# Patient Record
Sex: Male | Born: 1975 | Race: White | Hispanic: No | Marital: Married | Smoking: Never smoker
Health system: Southern US, Community
[De-identification: ages and names within clinical notes are randomized; demographics above are authoritative.]

## PROBLEM LIST (undated history)

## (undated) DIAGNOSIS — M5136 Other intervertebral disc degeneration, lumbar region: Secondary | ICD-10-CM

## (undated) DIAGNOSIS — M51369 Other intervertebral disc degeneration, lumbar region without mention of lumbar back pain or lower extremity pain: Secondary | ICD-10-CM

---
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
## 2015-11-28 ENCOUNTER — Emergency Department (HOSPITAL_COMMUNITY)

## 2015-11-28 ENCOUNTER — Emergency Department (HOSPITAL_COMMUNITY)
Admission: EM | Admit: 2015-11-28 | Discharge: 2015-11-28 | Disposition: A | Discharge status: Home / Self Care | Attending: Emergency Medicine | Admitting: Emergency Medicine

## 2015-11-28 ENCOUNTER — Encounter (HOSPITAL_COMMUNITY): Payer: Self-pay | Admitting: *Deleted

## 2015-11-28 DIAGNOSIS — Z79899 Other long term (current) drug therapy: Secondary | ICD-10-CM | POA: Diagnosis not present

## 2015-11-28 DIAGNOSIS — M546 Pain in thoracic spine: Secondary | ICD-10-CM

## 2015-11-28 HISTORY — DX: Other intervertebral disc degeneration, lumbar region: M51.36

## 2015-11-28 HISTORY — DX: Other intervertebral disc degeneration, lumbar region without mention of lumbar back pain or lower extremity pain: M51.369

## 2015-11-28 MED ORDER — HYDROCODONE-ACETAMINOPHEN 5-325 MG PO TABS: 1.0000 | ORAL_TABLET | ORAL | 0 refills | Status: DC | PRN

## 2015-11-28 MED ORDER — DIAZEPAM 5 MG PO TABS
5.0000 mg | ORAL_TABLET | Freq: Once | ORAL | Status: AC
  Administered 2015-11-28: 5 mg via ORAL
  Filled 2015-11-28: qty 1

## 2015-11-28 MED ORDER — HYDROCODONE-ACETAMINOPHEN 5-325 MG PO TABS
1.0000 | ORAL_TABLET | Freq: Once | ORAL | Status: AC
  Administered 2015-11-28: 1 via ORAL
  Filled 2015-11-28: qty 1

## 2015-11-28 MED ORDER — DIAZEPAM 5 MG PO TABS: 5.0000 mg | ORAL_TABLET | Freq: Two times a day (BID) | ORAL | 0 refills | Status: DC

## 2015-11-28 MED ORDER — IBUPROFEN 600 MG PO TABS: 600.0000 mg | ORAL_TABLET | Freq: Four times a day (QID) | ORAL | 0 refills | Status: DC | PRN

## 2015-11-28 NOTE — ED Notes (Signed)
Bed: WA02 Expected date:  Expected time:  Means of arrival:  Comments: 

## 2015-11-28 NOTE — ED Provider Notes (Signed)
[facility]-EMERGENCY DEPT Provider Note   CSN: [id number] Arrival date & time: 11/28/15  [id number]     History   Chief Complaint Chief Complaint  Patient presents with  . Back Pain  . Torticollis    HPI Adrian Park is a 40 y.o. male.  Patient is a 40 year old male who presents the ED with complaint of neck and back pain, onset this morning. Patient reports when he woke up this morning he began having "spasming" pain to his neck and upper back which she notes is worse with movement. He reports pain is worse when taking a deep breath. Denies radiation. Denies any known injury, trauma or recent falls but notes he was playing with his kids last night. Patient reports taking ibuprofen and muscle relaxant this morning without relief. Denies fever, headache, neck stiffness, chest pain, difficulty breathing, abdominal pain, numbness, tingling, weakness, saddle anesthesia, urinary/bowel incontinence, urinary symptoms. Reports hx of chronic lower back pain.      Past Medical History:  Diagnosis Date  . Degenerative disc disease, lumbar     There are no active problems to display for this patient.   Past Surgical History:  Procedure Laterality Date  . FRACTURE SURGERY Right 1996   index knuckle       Home Medications    Prior to Admission medications   Medication Sig Start Date End Date Taking? Authorizing Provider  allopurinol (ZYLOPRIM) 300 MG tablet Take 300 mg by mouth daily. 10/18/15  Yes Historical Provider, MD  cholecalciferol (VITAMIN D) 1000 units tablet Take 2,000 Units by mouth daily.   Yes Historical Provider, MD  diclofenac (VOLTAREN) 75 MG EC tablet Take 75 mg by mouth 2 (two) times daily. 11/19/15  Yes Historical Provider, MD  multivitamin (ONE-A-DAY MEN'S) TABS tablet Take 1 tablet by mouth daily.   Yes Historical Provider, MD  Omega-3 Fatty Acids (FISH OIL) 1000 MG CAPS Take 1,000 mg by mouth daily.   Yes Historical Provider, MD  diazepam (VALIUM) 5 MG tablet Take 1 tablet  (5 mg total) by mouth 2 (two) times daily. 11/28/15   Barrett HenleNicole Elizabeth Helios Kohlmann, PA-C  HYDROcodone-acetaminophen (NORCO/VICODIN) 5-325 MG tablet Take 1 tablet by mouth every 4 (four) hours as needed. 11/28/15   Barrett HenleNicole Elizabeth Maanya Hippert, PA-C  ibuprofen (ADVIL,MOTRIN) 600 MG tablet Take 1 tablet (600 mg total) by mouth every 6 (six) hours as needed. 11/28/15   Barrett HenleNicole Elizabeth Jyasia Markoff, PA-C    Family History No family history on file.  Social History Social History  Substance Use Topics  . Smoking status: Never Smoker  . Smokeless tobacco: Never Used  . Alcohol use No     Allergies   Review of patient's allergies indicates no known allergies.   Review of Systems Review of Systems  Musculoskeletal: Positive for back pain and neck pain.  All other systems reviewed and are negative.    Physical Exam Updated Vital Signs BP 112/65 (BP Location: Right Arm)   Pulse (!) 50   Temp 97.7 F (36.5 C) (Oral)   Resp 16   Ht 6\' 2"  (1.88 m)   Wt 102.1 kg   SpO2 98%   BMI 28.89 kg/m   Physical Exam  Constitutional: He is oriented to person, place, and time. He appears well-developed and well-nourished.  HENT:  Head: Normocephalic and atraumatic.  Mouth/Throat: Oropharynx is clear and moist. No oropharyngeal exudate.  Eyes: Conjunctivae and EOM are normal. Right eye exhibits no discharge. Left eye exhibits no discharge. No scleral icterus.  Neck: Normal  range of motion. Neck supple.  Cardiovascular: Normal rate, regular rhythm, normal heart sounds and intact distal pulses.   Pulmonary/Chest: Effort normal and breath sounds normal. No respiratory distress. He has no wheezes. He has no rales. He exhibits no tenderness.  Abdominal: Soft. Bowel sounds are normal. He exhibits no distension. There is no tenderness.  Musculoskeletal: Normal range of motion. He exhibits tenderness. He exhibits no edema or deformity.  No midline C or L tenderness. Midline mid-thoracic spine tenderness. TTP over  bilateral upper thoracic and cervical paraspinal muscles. Full range of motion of neck and back. Full range of motion of bilateral upper and lower extremities, with 5/5 strength. Sensation intact. 2+ radial and PT pulses. Cap refill <2 seconds.   Neurological: He is alert and oriented to person, place, and time.  Skin: Skin is warm and dry. Capillary refill takes less than 2 seconds.  Nursing note and vitals reviewed.    ED Treatments / Results  Labs (all labs ordered are listed, but only abnormal results are displayed) Labs Reviewed - No data to display  EKG  EKG Interpretation None       Radiology Dg Thoracic Spine 2 View  Result Date: 11/28/2015 CLINICAL DATA:  Back pain EXAM: THORACIC SPINE 2 VIEWS COMPARISON:  None. FINDINGS: There is no evidence of thoracic spine fracture. Alignment is normal. No other significant bone abnormalities are identified. IMPRESSION: Negative. Electronically Signed   By: Marlan Palau M.D.   On: 11/28/2015 13:17    Procedures Procedures (including critical care time)  Medications Ordered in ED Medications  diazepam (VALIUM) tablet 5 mg (5 mg Oral Given 11/28/15 1143)  HYDROcodone-acetaminophen (NORCO/VICODIN) 5-325 MG per tablet 1 tablet (1 tablet Oral Given 11/28/15 1143)     Initial Impression / Assessment and Plan / ED Course  I have reviewed the triage vital signs and the nursing notes.  Pertinent labs & imaging results that were available during my care of the patient were reviewed by me and considered in my medical decision making (see chart for details).  Clinical Course    Patient presents with upper back and neck pain that started this morning when he woke up. He reports he was playing with his kids last name but denies any known injury, recent fall or trauma. VSS. Exam revealed tenderness over bilateral upper thoracic and cervical paraspinal muscles, midline upper thoracic spinal tenderness. No neuro deficits. Patient given pain  meds and muscle relaxant in the ED. Thoracic spine x-ray unremarkable. On reevaluation patient reports his pain has improved. Suspect patient's pain is likely due to muscle strain/spasms. Plan to discharge patient home with pain meds, muscle relaxant and NSAID's along with symptomatically treatment. Advised patient to follow up with PCP as needed. Discussed return precautions.  Final Clinical Impressions(s) / ED Diagnoses   Final diagnoses:  Bilateral thoracic back pain    New Prescriptions New Prescriptions   DIAZEPAM (VALIUM) 5 MG TABLET    Take 1 tablet (5 mg total) by mouth 2 (two) times daily.   HYDROCODONE-ACETAMINOPHEN (NORCO/VICODIN) 5-325 MG TABLET    Take 1 tablet by mouth every 4 (four) hours as needed.   IBUPROFEN (ADVIL,MOTRIN) 600 MG TABLET    Take 1 tablet (600 mg total) by mouth every 6 (six) hours as needed.     Satira Sark [place], [place] 11/28/15 1354    Gerhard Munch, MD 11/29/15 [phone]

## 2015-11-28 NOTE — ED Triage Notes (Signed)
Per EMS, pt from home, reports he was playing with his kids last night, woke up this am with back and neck pain.  Took a muscle relaxer about an hour ago.  Pain radiates to his L shoulder as well.  Denies injury at this time.

## 2015-11-28 NOTE — ED Notes (Signed)
Patient transported to X-ray 

## 2015-11-28 NOTE — Discharge Instructions (Signed)
Take your medications as prescribed as an for pain relief. I also recommend applying heat to affected area for 15 minutes 3-4 times daily for pain relief. Follow-up with your primary care provider in the next 3-4 days if your symptoms have not improved. Please return to the Emergency Department if symptoms worsen or new onset of fever, numbness, tingling, groin anesthesia, loss of bowel or bladder, weakness.

## 2015-12-26 ENCOUNTER — Ambulatory Visit (INDEPENDENT_AMBULATORY_CARE_PROVIDER_SITE_OTHER): Admitting: Orthopedic Surgery

## 2015-12-26 DIAGNOSIS — M25562 Pain in left knee: Secondary | ICD-10-CM | POA: Diagnosis not present

## 2016-01-01 ENCOUNTER — Other Ambulatory Visit (INDEPENDENT_AMBULATORY_CARE_PROVIDER_SITE_OTHER): Payer: Self-pay | Admitting: Orthopedic Surgery

## 2016-01-01 DIAGNOSIS — M25562 Pain in left knee: Principal | ICD-10-CM

## 2016-01-01 DIAGNOSIS — G8929 Other chronic pain: Secondary | ICD-10-CM

## 2016-01-06 ENCOUNTER — Ambulatory Visit
Admission: RE | Admit: 2016-01-06 | Discharge: 2016-01-06 | Disposition: A | Discharge status: Home / Self Care | Source: Ambulatory Visit | Attending: Orthopedic Surgery | Admitting: Orthopedic Surgery

## 2016-01-06 DIAGNOSIS — G8929 Other chronic pain: Secondary | ICD-10-CM

## 2016-01-06 DIAGNOSIS — M25562 Pain in left knee: Principal | ICD-10-CM

## 2016-01-18 ENCOUNTER — Encounter (INDEPENDENT_AMBULATORY_CARE_PROVIDER_SITE_OTHER): Payer: Self-pay | Admitting: Orthopedic Surgery

## 2016-01-18 ENCOUNTER — Ambulatory Visit (INDEPENDENT_AMBULATORY_CARE_PROVIDER_SITE_OTHER): Admitting: Orthopedic Surgery

## 2016-01-18 DIAGNOSIS — G8929 Other chronic pain: Secondary | ICD-10-CM | POA: Diagnosis not present

## 2016-01-18 DIAGNOSIS — M25562 Pain in left knee: Secondary | ICD-10-CM

## 2016-01-18 NOTE — Progress Notes (Signed)
Office Visit Note   Patient: Adrian Park           Date of Birth: 04-Mar-1976           MRN: [id number] Visit Date: 01/18/2016 Requested by: Joycelyn RuaStephen Meyers, MD [address] [place], [place] [address] PCP: Joycelyn RuaMEYERS, STEPHEN, MD  Subjective: Chief Complaint  Patient presents with  . Left Knee - Pain, Follow-up    HPI Adrian Park is a 40 year old patient with left knee pain.  Her follow-up MRI scan which shows some trochlear focal chondromalacia on the lateral aspect over a 5 x 5 mm area as well as what is interpreted as edema in the posterior medial tibial plateau.  I reviewed the MRI scan and based on his running history of think this represents an early stress reaction.  It is now resolving.  He describes improvement in symptoms but he has decreased his running.  He states that the his knee will continue to buckle randomly.  Stairs are okay for him.  He used to run 5-10 miles a day now he just wants to run 2 miles a day and then walk a mile a day to do that twice a day.  Recently diagnosed with psoriatic arthritis and is going to start Humira injections.  He is taking vitamin D.              Review of Systems All systems reviewed are negative as they relate to the chief complaint within the history of present illness.  Patient denies  fevers or chills.    Assessment & Plan: Visit Diagnoses:  1. Chronic pain of left knee     Plan: Impression is left knee pain with small area of focal chondromalacia on the lateral aspect of the trochlea.  I'll think this is necessarily symptomatic for him.  I think probably more symptomatically for him as this stress reaction posterior medial tibial plateau.  Think it is resolving with diminished running.  We talked a lot about activity modification in this clinical scenario.  He needs to add swimming and decrease his running in order to prevent this stress reaction from recurring.  Her basic decision point today was is there an operative problem in the knee  causing his giving way in the answer is no.  However I think that less running is going to be better for this stress reaction.  I'll see him back as needed  Follow-Up Instructions: Return if symptoms worsen or fail to improve.   Orders:  No orders of the defined types were placed in this encounter.  No orders of the defined types were placed in this encounter.     Procedures: No procedures performed   Clinical Data: No additional findings.  Objective: Vital Signs: There were no vitals taken for this visit.  Physical Exam  Constitutional: He appears well-developed.  HENT:  Head: Normocephalic.  Eyes: EOM are normal.  Neck: Normal range of motion.  Cardiovascular: Normal rate.   Pulmonary/Chest: Effort normal.  Neurological: He is alert.  Skin: Skin is warm.  Psychiatric: He has a normal mood and affect.    Ortho Exam left knee has full active and passive range of motion stable, crucial ligament intact since mechanism not much in the way of patellofemoral crepitus palpable pedal pulses no groin pain with internal/external rotation leg no other masses limitations incision of the left knee region  Specialty Comments:  No specialty comments available.  Imaging: No results found.   PMFS History:  There are no active problems to display for this patient.  Past Medical History:  Diagnosis Date  . Degenerative disc disease, lumbar     No family history on file.  Past Surgical History:  Procedure Laterality Date  . FRACTURE SURGERY Right 1996   index knuckle   Social History   Occupational History  . Not on file.   Social History Main Topics  . Smoking status: Never Smoker  . Smokeless tobacco: Never Used  . Alcohol use No  . Drug use: No  . Sexual activity: Not on file

## 2017-08-31 IMAGING — MR MR KNEE*L* W/O CM
6 series · 40 of 40 positions shown · non-contrast
Comparison: Radiographs dated 07/04/2015

CLINICAL DATA: Chronic left knee pain. The patient has been giving
way for 6 months. Recurrent swelling after exercise.

EXAM:
MRI OF THE LEFT KNEE WITHOUT CONTRAST
TECHNIQUE: Multiplanar, multisequence MR imaging of the knee was performed. No
intravenous contrast was administered.

[Series 3: PD · axial · 4.0mm · 0.50mm/px · z∈[-102,+36]mm · 8 of 30 slices shown (1 of 2)]
[im 1/30]
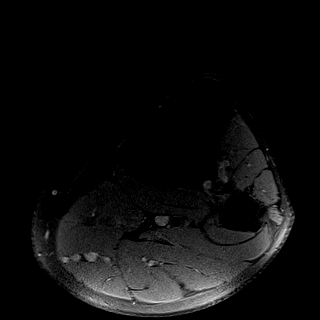
[im 5/30]
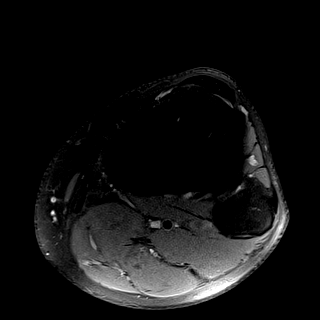
[im 9/30]
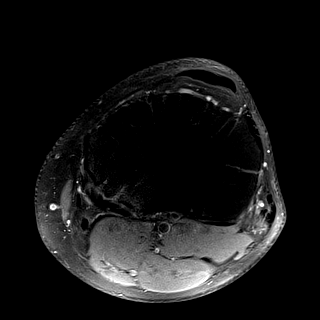
[im 13/30]
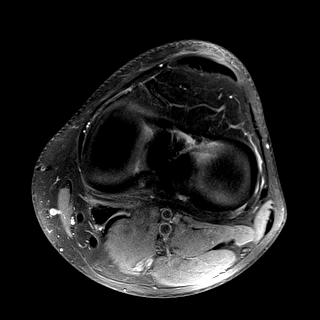
[im 17/30]
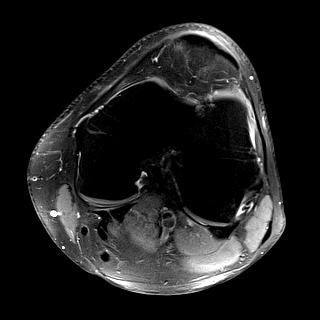
[im 21/30]
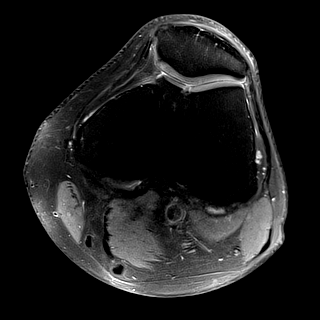
[im 25/30]
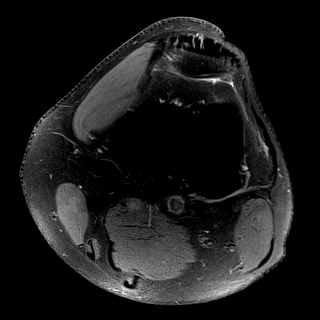
[im 30/30]
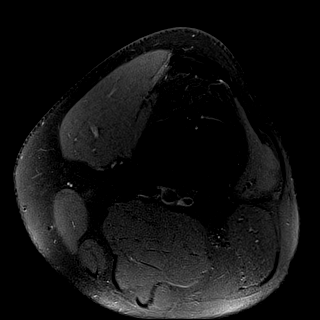

[Series 4: PD fat-sat · sagittal · 4.0mm · 0.50mm/px · 8 of 25 slices shown (1 of 2)]
[im 1/25]
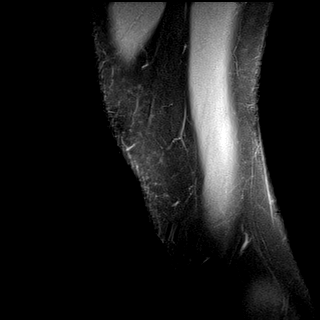
[im 4/25]
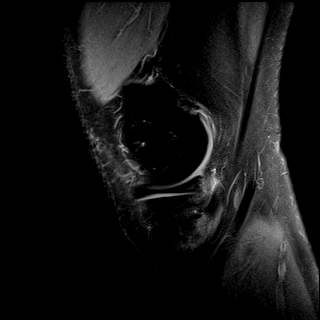
[im 7/25]
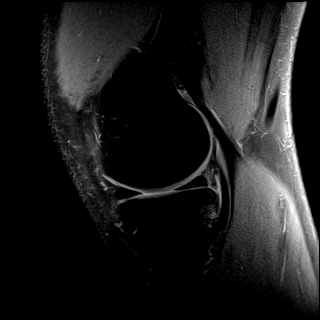
[im 11/25]
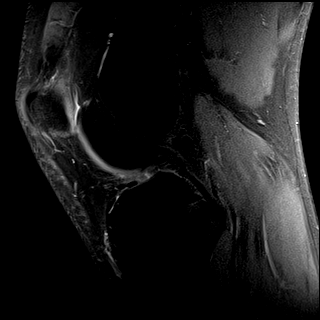
[im 14/25]
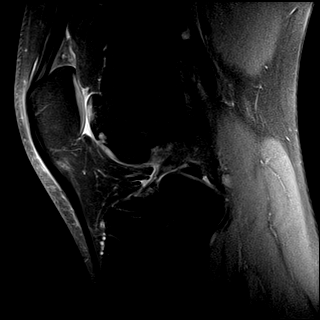
[im 18/25]
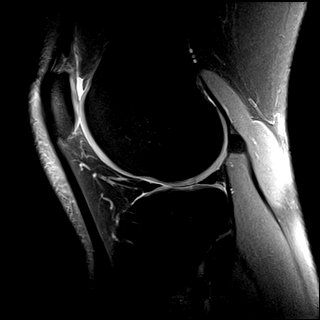
[im 21/25]
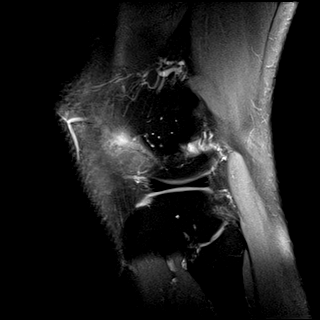
[im 25/25]
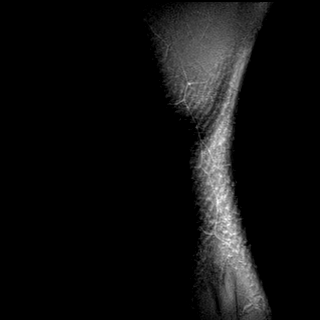

[Series 5: PD · coronal · 4.0mm · 0.53mm/px · 7 of 22 slices shown (2 of 2)]
[im 1/22]
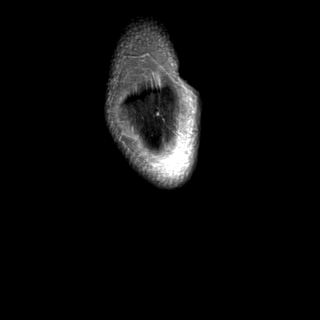
[im 4/22]
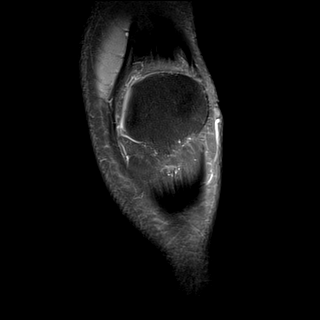
[im 8/22]
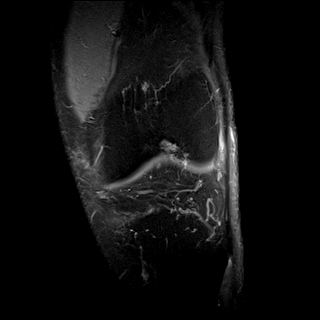
[im 11/22]
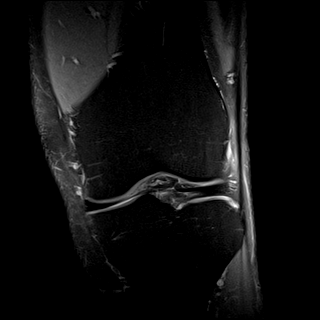
[im 15/22]
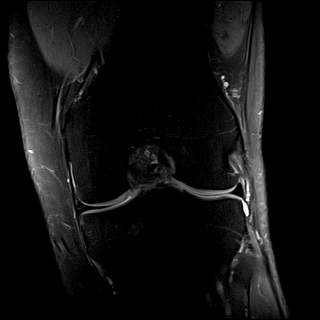
[im 18/22]
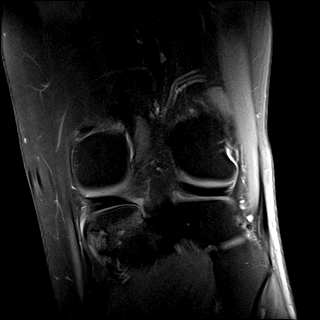
[im 22/22]
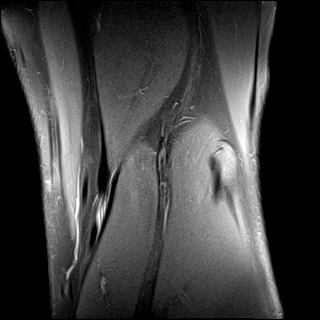

[Series 6: (id) · coronal · 4.0mm · 0.53mm/px · 7 of 22 slices shown]
[im 1/22]
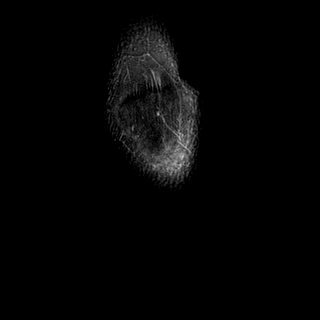
[im 4/22]
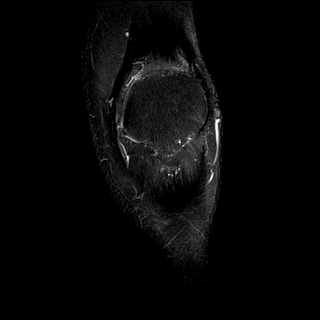
[im 8/22]
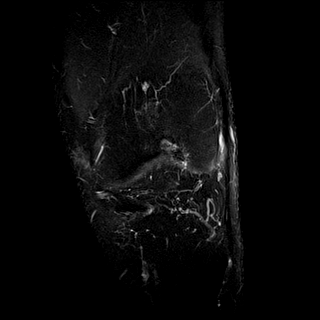
[im 11/22]
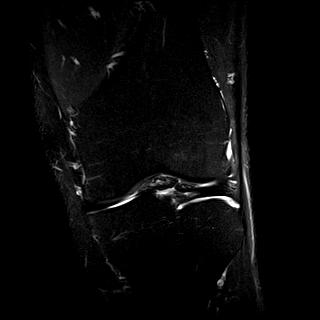
[im 15/22]
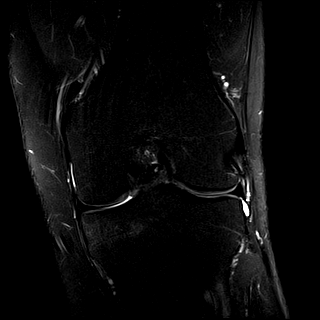
[im 18/22]
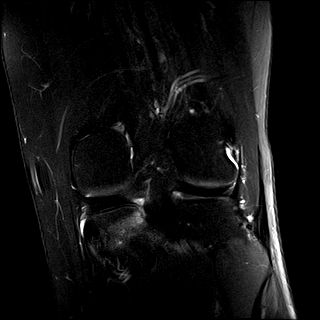
[im 22/22]
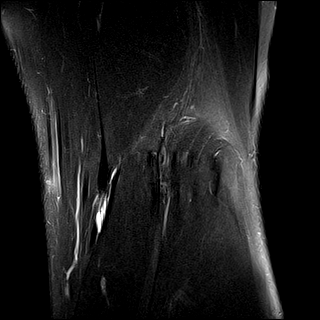

[Series 7: T1 · coronal · 4.0mm · 0.53mm/px · 7 of 22 slices shown]
[im 1/22]
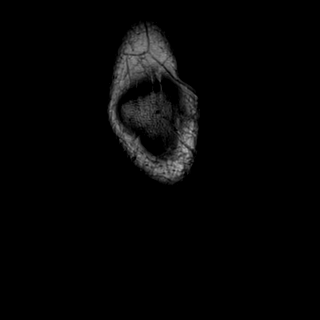
[im 4/22]
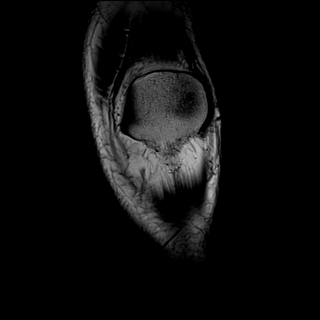
[im 8/22]
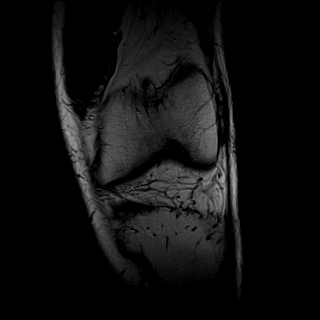
[im 11/22]
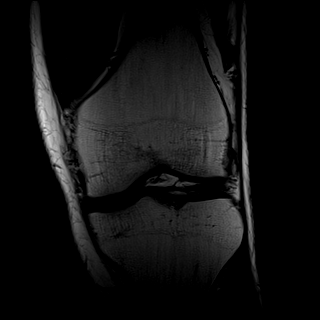
[im 15/22]
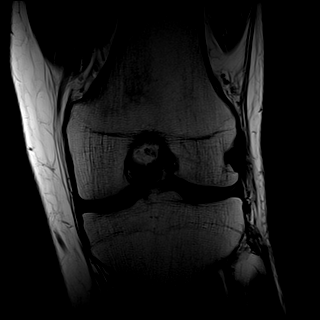
[im 18/22]
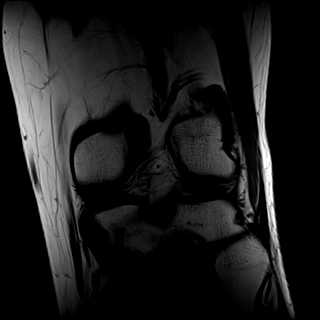
[im 22/22]
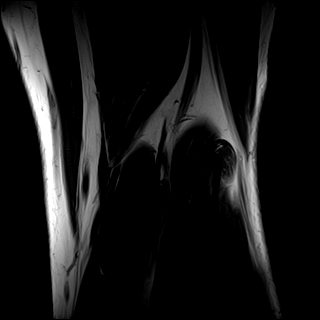

[Series 8: PD fat-sat · coronal · 2.0mm · 0.59mm/px · 3 of 11 slices shown (2 of 2)]
[im 1/11]
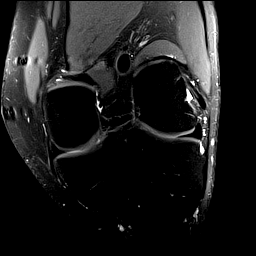
[im 6/11]
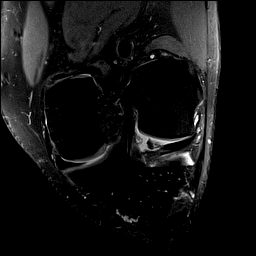
[im 11/11]
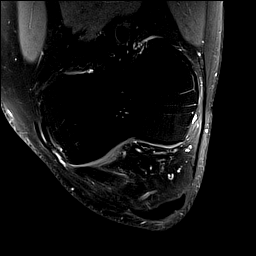

[40 of 40 positions shown; findings below may reference images not displayed]

FINDINGS: MENISCI

Medial meniscus:  Intact.

Lateral meniscus:  Intact.

LIGAMENTS

Cruciates:  Intact ACL and PCL.

Collaterals: Medial collateral ligament is intact. Lateral
collateral ligament complex is intact.

CARTILAGE

Patellofemoral: Extensive chondromalacia in the lateral aspect of
the trochlear groove of the distal femur. The articular cartilage of
the patella appears normal.

Medial:  Normal.

Lateral:  Normal.

Joint:  No joint effusion. Normal Hoffa's fat. No plical thickening.

Popliteal Fossa:  No significant abnormality.

Extensor Mechanism: Minimal chronic degenerative changes of the
origin of the patellar tendon at the lower pole of the patella.

Bones: Subtle subcortical edema of the posterior aspect of the
medial tibial plateau just medial to the posterior cruciate ligament
insertion. This is nonspecific.

Other: None.
IMPRESSION: 1. Extensive grade 4 chondromalacia of the lateral aspect of the
trochlear groove of the distal femur.
2. Focal tendinopathy of the proximal patellar tendon.
3. Unusual of subcortical edema at the posterior aspect of the
medial tibial plateau of unknown etiology.

## 2019-05-24 ENCOUNTER — Other Ambulatory Visit: Payer: Self-pay | Admitting: Rheumatology

## 2019-06-03 ENCOUNTER — Ambulatory Visit
Admission: RE | Admit: 2019-06-03 | Discharge: 2019-06-03 | Disposition: A | Discharge status: Home / Self Care | Source: Ambulatory Visit | Attending: Rheumatology | Admitting: Rheumatology

## 2020-04-11 ENCOUNTER — Other Ambulatory Visit: Payer: Self-pay

## 2020-04-11 ENCOUNTER — Ambulatory Visit (INDEPENDENT_AMBULATORY_CARE_PROVIDER_SITE_OTHER): Admitting: Podiatry

## 2020-04-11 ENCOUNTER — Encounter: Payer: Self-pay | Admitting: Podiatry

## 2020-04-11 DIAGNOSIS — M1A00X0 Idiopathic chronic gout, unspecified site, without tophus (tophi): Secondary | ICD-10-CM | POA: Insufficient documentation

## 2020-04-11 DIAGNOSIS — L409 Psoriasis, unspecified: Secondary | ICD-10-CM | POA: Insufficient documentation

## 2020-04-11 DIAGNOSIS — E789 Disorder of lipoprotein metabolism, unspecified: Secondary | ICD-10-CM | POA: Insufficient documentation

## 2020-04-11 DIAGNOSIS — L600 Ingrowing nail: Secondary | ICD-10-CM | POA: Diagnosis not present

## 2020-04-11 DIAGNOSIS — F418 Other specified anxiety disorders: Secondary | ICD-10-CM | POA: Insufficient documentation

## 2020-04-11 DIAGNOSIS — M459 Ankylosing spondylitis of unspecified sites in spine: Secondary | ICD-10-CM | POA: Insufficient documentation

## 2020-04-11 DIAGNOSIS — M109 Gout, unspecified: Secondary | ICD-10-CM | POA: Insufficient documentation

## 2020-04-11 DIAGNOSIS — K580 Irritable bowel syndrome with diarrhea: Secondary | ICD-10-CM | POA: Insufficient documentation

## 2020-04-11 NOTE — Patient Instructions (Signed)

## 2020-04-17 ENCOUNTER — Encounter: Payer: Self-pay | Admitting: Podiatry

## 2020-04-17 NOTE — Progress Notes (Signed)
  Subjective:  Patient ID: Adrian Park, male    DOB: 12/25/1975,  MRN: [id number]  Chief Complaint  Patient presents with  . Toe Pain    Hallux left - medial border - tender x few months, gets red, sore with pressure  . Nail Problem    4th toenail left - discolored  . New Patient (Initial Visit)    45 y.o. male presents with the above complaint.  Patient presents with ingrown medial border of the left hallux.  Patient states is tender to touch  or with pressure.  He states he is going to be leaving for [employer] camp and would like to know if is a good idea to get ingrown out now or hold off until afterwards.  He will be gone for 2 weeks and then we can reschedule appointment to have the ingrown nail removed.  He denies any other acute complaints he would like to get more information I have to go by taking the nail out.  He has not seen anyone else prior to seeing me   Review of Systems: Negative except as noted in the HPI. Denies N/V/F/Ch.  Past Medical History:  Diagnosis Date  . Degenerative disc disease, lumbar     Current Outpatient Medications:  .  Adalimumab (HUMIRA) 40 MG/0.8ML PSKT, Inject into the skin., Disp: , Rfl:  .  atorvastatin (LIPITOR) 20 MG tablet, Take 20 mg by mouth daily., Disp: , Rfl:  .  cholecalciferol (VITAMIN D) 1000 units tablet, Take 2,000 Units by mouth daily., Disp: , Rfl:  .  FLUoxetine (PROZAC) 20 MG capsule, Take 20 mg by mouth every morning., Disp: , Rfl:  .  lamoTRIgine (LAMICTAL) 100 MG tablet, Take 25 mg by mouth daily., Disp: , Rfl:  .  VIBERZI 75 MG TABS, Take 1 tablet by mouth 2 (two) times daily., Disp: , Rfl:   Social History   Tobacco Use  Smoking Status Never Smoker  Smokeless Tobacco Never Used    No Known Allergies Objective:  There were no vitals filed for this visit. There is no height or weight on file to calculate BMI. Constitutional Well developed. Well nourished.  Vascular Dorsalis pedis pulses palpable  bilaterally. Posterior tibial pulses palpable bilaterally. Capillary refill normal to all digits.  No cyanosis or clubbing noted. Pedal hair growth normal.  Neurologic Normal speech. Oriented to person, place, and time. Epicritic sensation to light touch grossly present bilaterally.  Dermatologic Painful ingrowing nail at medial nail borders of the hallux nail left. No other open wounds. No skin lesions.  Orthopedic: Normal joint ROM without pain or crepitus bilaterally. No visible deformities. No bony tenderness.   Radiographs: None Assessment:   1. Ingrown left big toenail    Plan:  Patient was evaluated and treated and all questions answered.  Ingrown Nail, left -I will hold off on doing a nail procedure at this time given that he will be leaving for [employer] camp soon.  He will be coming back again in 2 weeks after the cam to undergo the procedure.  I discussed with him that if he continues to hurt we can do it prior to going for the camp however given that he will be very aggressive on the foot and may not be able to heal appropriately I believe he will benefit from just holding off for now.  He states understanding.  No follow-ups on file.

## 2021-09-06 ENCOUNTER — Other Ambulatory Visit: Payer: Self-pay

## 2021-09-06 ENCOUNTER — Encounter (HOSPITAL_BASED_OUTPATIENT_CLINIC_OR_DEPARTMENT_OTHER): Payer: Self-pay | Admitting: Emergency Medicine

## 2021-09-06 ENCOUNTER — Emergency Department (HOSPITAL_BASED_OUTPATIENT_CLINIC_OR_DEPARTMENT_OTHER)
Admission: EM | Admit: 2021-09-06 | Discharge: 2021-09-06 | Discharge status: Against Medical Advice | Attending: Emergency Medicine | Admitting: Emergency Medicine

## 2021-09-06 DIAGNOSIS — S0096XA Insect bite (nonvenomous) of unspecified part of head, initial encounter: Secondary | ICD-10-CM | POA: Insufficient documentation

## 2021-09-06 DIAGNOSIS — Z5321 Procedure and treatment not carried out due to patient leaving prior to being seen by health care provider: Secondary | ICD-10-CM | POA: Insufficient documentation

## 2021-09-06 DIAGNOSIS — W57XXXA Bitten or stung by nonvenomous insect and other nonvenomous arthropods, initial encounter: Secondary | ICD-10-CM | POA: Diagnosis not present

## 2021-09-06 NOTE — ED Triage Notes (Signed)
Patient presents to ED via POV from PCP office. Patient expresses concern for a possible bug bite to right temple. Patient thought it was a pimple and attempted to "pop" it. Fevers at home. Last tylenol at 1300. Reports eye swelling.

## 2022-12-31 ENCOUNTER — Other Ambulatory Visit: Payer: Self-pay | Admitting: Rheumatology

## 2022-12-31 DIAGNOSIS — R7989 Other specified abnormal findings of blood chemistry: Secondary | ICD-10-CM

## 2023-01-02 ENCOUNTER — Ambulatory Visit
Admission: RE | Admit: 2023-01-02 | Discharge: 2023-01-02 | Disposition: A | Discharge status: Home / Self Care | Source: Ambulatory Visit | Attending: Rheumatology | Admitting: Rheumatology

## 2023-01-02 DIAGNOSIS — R7989 Other specified abnormal findings of blood chemistry: Secondary | ICD-10-CM

## 2023-03-23 ENCOUNTER — Ambulatory Visit: Admitting: Podiatry

## 2024-03-16 ENCOUNTER — Ambulatory Visit: Admitting: Podiatry

## 2024-03-18 ENCOUNTER — Ambulatory Visit: Admitting: Podiatry

## 2024-03-18 DIAGNOSIS — M7662 Achilles tendinitis, left leg: Secondary | ICD-10-CM | POA: Diagnosis not present

## 2024-03-18 NOTE — Progress Notes (Unsigned)
 Left achilles tennditis CAM boot

## 2024-04-15 ENCOUNTER — Ambulatory Visit: Admitting: Podiatry

## 2024-04-15 NOTE — Progress Notes (Unsigned)
 Left achilles tenditis triloack injectoin
